# Patient Record
Sex: Female | Born: 1960 | Race: White | Hispanic: No | Smoking: Current every day smoker
Health system: Southern US, Community
[De-identification: ages and names within clinical notes are randomized; demographics above are authoritative.]

## PROBLEM LIST (undated history)

## (undated) DIAGNOSIS — E119 Type 2 diabetes mellitus without complications: Secondary | ICD-10-CM

## (undated) DIAGNOSIS — F319 Bipolar disorder, unspecified: Secondary | ICD-10-CM

## (undated) HISTORY — PX: CHOLECYSTECTOMY: SHX55

## (undated) HISTORY — PX: LEG SURGERY: SHX1003

## (undated) HISTORY — PX: PARTIAL HYSTERECTOMY: SHX80

## (undated) HISTORY — PX: EYE SURGERY: SHX253

## (undated) HISTORY — PX: THYROIDECTOMY: SHX17

---

## 1999-04-17 ENCOUNTER — Emergency Department (HOSPITAL_COMMUNITY): Admission: EM | Admit: 1999-04-17 | Discharge: 1999-04-17 | Payer: Self-pay | Admitting: Emergency Medicine

## 1999-08-13 ENCOUNTER — Emergency Department (HOSPITAL_COMMUNITY): Admission: EM | Admit: 1999-08-13 | Discharge: 1999-08-13 | Payer: Self-pay | Admitting: Emergency Medicine

## 1999-12-06 ENCOUNTER — Emergency Department (HOSPITAL_COMMUNITY): Admission: EM | Admit: 1999-12-06 | Discharge: 1999-12-06 | Payer: Self-pay | Admitting: Emergency Medicine

## 1999-12-10 ENCOUNTER — Inpatient Hospital Stay (HOSPITAL_COMMUNITY): Admission: EM | Admit: 1999-12-10 | Discharge: 1999-12-14 | Payer: Self-pay | Admitting: *Deleted

## 1999-12-19 ENCOUNTER — Encounter: Admission: RE | Admit: 1999-12-19 | Discharge: 1999-12-19 | Payer: Self-pay | Admitting: Neurology

## 1999-12-19 ENCOUNTER — Encounter: Payer: Self-pay | Admitting: Neurology

## 1999-12-20 ENCOUNTER — Emergency Department (HOSPITAL_COMMUNITY): Admission: EM | Admit: 1999-12-20 | Discharge: 1999-12-20 | Payer: Self-pay | Admitting: Emergency Medicine

## 2000-05-19 ENCOUNTER — Emergency Department (HOSPITAL_COMMUNITY): Admission: EM | Admit: 2000-05-19 | Discharge: 2000-05-19 | Payer: Self-pay | Admitting: Emergency Medicine

## 2000-05-19 ENCOUNTER — Encounter: Payer: Self-pay | Admitting: Emergency Medicine

## 2000-06-18 ENCOUNTER — Emergency Department (HOSPITAL_COMMUNITY): Admission: EM | Admit: 2000-06-18 | Discharge: 2000-06-18 | Payer: Self-pay | Admitting: Emergency Medicine

## 2000-06-24 ENCOUNTER — Emergency Department (HOSPITAL_COMMUNITY): Admission: EM | Admit: 2000-06-24 | Discharge: 2000-06-24 | Payer: Self-pay | Admitting: Emergency Medicine

## 2001-01-28 ENCOUNTER — Encounter: Payer: Self-pay | Admitting: Emergency Medicine

## 2001-01-28 ENCOUNTER — Emergency Department (HOSPITAL_COMMUNITY): Admission: EM | Admit: 2001-01-28 | Discharge: 2001-01-28 | Payer: Self-pay | Admitting: Emergency Medicine

## 2001-01-30 ENCOUNTER — Emergency Department (HOSPITAL_COMMUNITY): Admission: EM | Admit: 2001-01-30 | Discharge: 2001-01-31 | Payer: Self-pay | Admitting: *Deleted

## 2001-09-06 ENCOUNTER — Emergency Department (HOSPITAL_COMMUNITY): Admission: EM | Admit: 2001-09-06 | Discharge: 2001-09-06 | Payer: Self-pay | Admitting: Emergency Medicine

## 2001-09-06 ENCOUNTER — Encounter: Payer: Self-pay | Admitting: Emergency Medicine

## 2006-04-10 ENCOUNTER — Emergency Department (HOSPITAL_COMMUNITY): Admission: EM | Admit: 2006-04-10 | Discharge: 2006-04-10 | Payer: Self-pay | Admitting: Emergency Medicine

## 2008-09-25 ENCOUNTER — Emergency Department (HOSPITAL_COMMUNITY): Admission: EM | Admit: 2008-09-25 | Discharge: 2008-09-25 | Payer: Self-pay | Admitting: Emergency Medicine

## 2011-07-27 LAB — GLUCOSE, CAPILLARY

## 2015-02-16 ENCOUNTER — Emergency Department (HOSPITAL_COMMUNITY)
Admission: EM | Admit: 2015-02-16 | Discharge: 2015-02-17 | Disposition: A | Discharge status: Home / Self Care | Payer: Self-pay | Attending: Emergency Medicine | Admitting: Emergency Medicine

## 2015-02-16 DIAGNOSIS — Z765 Malingerer [conscious simulation]: Secondary | ICD-10-CM | POA: Insufficient documentation

## 2015-02-16 DIAGNOSIS — Z720 Tobacco use: Secondary | ICD-10-CM | POA: Insufficient documentation

## 2015-02-16 DIAGNOSIS — Z880 Allergy status to penicillin: Secondary | ICD-10-CM | POA: Insufficient documentation

## 2015-02-16 DIAGNOSIS — Y9389 Activity, other specified: Secondary | ICD-10-CM | POA: Insufficient documentation

## 2015-02-16 DIAGNOSIS — Y998 Other external cause status: Secondary | ICD-10-CM | POA: Insufficient documentation

## 2015-02-16 DIAGNOSIS — M546 Pain in thoracic spine: Secondary | ICD-10-CM

## 2015-02-16 DIAGNOSIS — Z794 Long term (current) use of insulin: Secondary | ICD-10-CM | POA: Insufficient documentation

## 2015-02-16 DIAGNOSIS — M533 Sacrococcygeal disorders, not elsewhere classified: Secondary | ICD-10-CM

## 2015-02-16 DIAGNOSIS — S299XXA Unspecified injury of thorax, initial encounter: Secondary | ICD-10-CM | POA: Insufficient documentation

## 2015-02-16 DIAGNOSIS — Y92410 Unspecified street and highway as the place of occurrence of the external cause: Secondary | ICD-10-CM | POA: Insufficient documentation

## 2015-02-16 DIAGNOSIS — F319 Bipolar disorder, unspecified: Secondary | ICD-10-CM | POA: Insufficient documentation

## 2015-02-16 DIAGNOSIS — E119 Type 2 diabetes mellitus without complications: Secondary | ICD-10-CM | POA: Insufficient documentation

## 2015-02-16 DIAGNOSIS — S3992XA Unspecified injury of lower back, initial encounter: Secondary | ICD-10-CM | POA: Insufficient documentation

## 2015-02-16 DIAGNOSIS — Z79899 Other long term (current) drug therapy: Secondary | ICD-10-CM | POA: Insufficient documentation

## 2015-02-16 HISTORY — DX: Bipolar disorder, unspecified: F31.9

## 2015-02-16 HISTORY — DX: Type 2 diabetes mellitus without complications: E11.9
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
## 2015-02-17 ENCOUNTER — Encounter (HOSPITAL_COMMUNITY): Payer: Self-pay

## 2015-02-17 MED ORDER — CYCLOBENZAPRINE HCL 10 MG PO TABS: 10.0000 mg | ORAL_TABLET | Freq: Three times a day (TID) | ORAL | Status: DC | PRN

## 2015-02-17 NOTE — ED Notes (Signed)
Pt refused to have CBG checked twice. Stated "the doctor was rude and had I have had enough of this." Pt then left room to find her husband in another ED room. Then both patients left without signing out or taking paperwork or prescriptions. Both stating they were unhappy with EDP and outcome.
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
## 2015-02-17 NOTE — ED Notes (Signed)
Pt instructed that her paperwork and prescription was ready, stated she did not want it. Pt then left with her husband, both stating the Doctor was rude, not helpful and they did not want anything from her. Pt was A&Ox4 on departure, all belongings with her as she left.
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
## 2015-06-29 DIAGNOSIS — Z880 Allergy status to penicillin: Secondary | ICD-10-CM | POA: Insufficient documentation

## 2015-06-29 DIAGNOSIS — E119 Type 2 diabetes mellitus without complications: Secondary | ICD-10-CM | POA: Diagnosis not present

## 2015-06-29 DIAGNOSIS — Z794 Long term (current) use of insulin: Secondary | ICD-10-CM | POA: Diagnosis not present

## 2015-06-29 DIAGNOSIS — M546 Pain in thoracic spine: Secondary | ICD-10-CM | POA: Diagnosis not present

## 2015-06-29 DIAGNOSIS — F319 Bipolar disorder, unspecified: Secondary | ICD-10-CM | POA: Insufficient documentation

## 2015-06-29 DIAGNOSIS — Z79899 Other long term (current) drug therapy: Secondary | ICD-10-CM | POA: Insufficient documentation

## 2015-06-29 DIAGNOSIS — Z720 Tobacco use: Secondary | ICD-10-CM | POA: Diagnosis not present

## 2015-06-29 DIAGNOSIS — M545 Low back pain: Secondary | ICD-10-CM | POA: Diagnosis not present

## 2015-06-30 ENCOUNTER — Emergency Department (HOSPITAL_COMMUNITY)
Admission: EM | Admit: 2015-06-30 | Discharge: 2015-06-30 | Disposition: A | Discharge status: Home / Self Care | Payer: Medicaid Other | Attending: Emergency Medicine | Admitting: Emergency Medicine

## 2015-06-30 ENCOUNTER — Emergency Department (HOSPITAL_COMMUNITY): Payer: Medicaid Other

## 2015-06-30 ENCOUNTER — Encounter (HOSPITAL_COMMUNITY): Payer: Self-pay | Admitting: Emergency Medicine

## 2015-06-30 DIAGNOSIS — M5489 Other dorsalgia: Secondary | ICD-10-CM

## 2015-06-30 DIAGNOSIS — W19XXXA Unspecified fall, initial encounter: Secondary | ICD-10-CM

## 2015-06-30 MED ORDER — OXYCODONE-ACETAMINOPHEN 5-325 MG PO TABS: 1.0000 | ORAL_TABLET | Freq: Four times a day (QID) | ORAL | Status: DC | PRN

## 2015-06-30 MED ORDER — OXYCODONE-ACETAMINOPHEN 5-325 MG PO TABS
1.0000 | ORAL_TABLET | Freq: Once | ORAL | Status: AC
Start: 2015-06-30 — End: 2015-06-30
  Administered 2015-06-30: 1 via ORAL
  Filled 2015-06-30: qty 1

## 2015-06-30 NOTE — Discharge Instructions (Signed)
Ibuprofen 600 mg 3 times daily for the next 5 days. Percocet as prescribed as needed for pain not relieved with ibuprofen.  Follow-up with a primary care provider. The resource guide has been provided to assist you with this.   Back Pain, Adult Low back pain is very common. About 1 in 5 people have back pain.The cause of low back pain is rarely dangerous. The pain often gets better over time.About half of people with a sudden onset of back pain feel better in just 2 weeks. About 8 in 10 people feel better by 6 weeks.  CAUSES Some common causes of back pain include:  Strain of the muscles or ligaments supporting the spine.  Wear and tear (degeneration) of the spinal discs.  Arthritis.  Direct injury to the back. DIAGNOSIS Most of the time, the direct cause of low back pain is not known.However, back pain can be treated effectively even when the exact cause of the pain is unknown.Answering your caregiver's questions about your overall health and symptoms is one of the most accurate ways to make sure the cause of your pain is not dangerous. If your caregiver needs more information, he or she may order lab work or imaging tests (X-rays or MRIs).However, even if imaging tests show changes in your back, this usually does not require surgery. HOME CARE INSTRUCTIONS For many people, back pain returns.Since low back pain is rarely dangerous, it is often a condition that people can learn to [facility] their own.   Remain active. It is stressful on the back to sit or stand in one place. Do not sit, drive, or stand in one place for more than 30 minutes at a time. Take short walks on level surfaces as soon as pain allows.Try to increase the length of time you walk each day.  Do not stay in bed.Resting more than 1 or 2 days can delay your recovery.  Do not avoid exercise or work.Your body is made to move.It is not dangerous to be active, even though your back may hurt.Your back will likely heal  faster if you return to being active before your pain is gone.  Pay attention to your body when you bend and lift. Many people have less discomfortwhen lifting if they bend their knees, keep the load close to their bodies,and avoid twisting. Often, the most comfortable positions are those that put less stress on your recovering back.  Find a comfortable position to sleep. Use a firm mattress and lie on your side with your knees slightly bent. If you lie on your back, put a pillow under your knees.  Only take over-the-counter or prescription medicines as directed by your caregiver. Over-the-counter medicines to reduce pain and inflammation are often the most helpful.Your caregiver may prescribe muscle relaxant drugs.These medicines help dull your pain so you can more quickly return to your normal activities and healthy exercise.  Put ice on the injured area.  Put ice in a plastic bag.  Place a towel between your skin and the bag.  Leave the ice on for 15-20 minutes, 03-04 times a day for the first 2 to 3 days. After that, ice and heat may be alternated to reduce pain and spasms.  Ask your caregiver about trying back exercises and gentle massage. This may be of some benefit.  Avoid feeling anxious or stressed.Stress increases muscle tension and can worsen back pain.It is important to recognize when you are anxious or stressed and learn ways to manage it.Exercise is a  great option. SEEK MEDICAL CARE IF:  You have pain that is not relieved with rest or medicine.  You have pain that does not improve in 1 week.  You have new symptoms.  You are generally not feeling well. SEEK IMMEDIATE MEDICAL CARE IF:   You have pain that radiates from your back into your legs.  You develop new bowel or bladder control problems.  You have unusual weakness or numbness in your arms or legs.  You develop nausea or vomiting.  You develop abdominal pain.  You feel faint. Document Released:  10/08/2005 Document Revised: 04/08/2012 Document Reviewed: 02/09/2014 [facility] Patient Information 2015 [place], [place]. This information is not intended to replace advice given to you by your health care provider. Make sure you discuss any questions you have with your health care provider.   Emergency Department Resource Guide 1) Find a Doctor and Pay Out of Pocket Although you won't have to find out who is covered by your insurance plan, it is a good idea to ask around and get recommendations. You will then need to call the office and see if the doctor you have chosen will accept you as a new patient and what types of options they offer for patients who are self-pay. Some doctors offer discounts or will set up payment plans for their patients who do not have insurance, but you will need to ask so you aren't surprised when you get to your appointment.  2) Contact Your Local Health Department Not all health departments have doctors that can see patients for sick visits, but many do, so it is worth a call to see if yours does. If you don't know where your local health department is, you can check in your phone book. The CDC also has a tool to help you locate your state's health department, and many state websites also have listings of all of their local health departments.  3) Find a Walk-in Clinic If your illness is not likely to be very severe or complicated, you may want to try a walk in clinic. These are popping up all over the country in pharmacies, drugstores, and shopping centers. They're usually staffed by nurse practitioners or physician assistants that have been trained to treat common illnesses and complaints. They're usually fairly quick and inexpensive. However, if you have serious medical issues or chronic medical problems, these are probably not your best option.  No Primary Care Doctor: - Call Health Connect at  [phone] - they can help you locate a primary care doctor that  accepts  your insurance, provides certain services, etc. - Physician Referral Service- [phone]  Chronic Pain Problems: Organization         Address  Phone   Notes  [facility]  [phone] Patients need to be referred by their primary care doctor.   Medication Assistance: Organization         Address  Phone   Notes  [facility] Medication [facility] [address] [place] [place], [place] [address] [phone] --Must be a resident of [facility] -- Must have NO insurance coverage whatsoever (no Medicaid/ Medicare, etc.) -- The pt. MUST have a primary care doctor that directs their care regularly and follows them in the community   [facility]  [phone]   [employer]  [phone]    Agencies that provide inexpensive medical care: Organization         Address  Phone   Notes  Patrcia Dolly  [facility]  [phone]   [facility] Internal Medicine    [phone]   [facility] [address] [place], [place] [address] [phone]   [facility] [address] [place], [place] [phone]   Planned Parenthood    6098613880   [facility]    [phone]   [facility]  [address], [place] Phone:  [phone], Fax:  [phone] Hours of Operation:  9 am - 6 pm, M-F.  Also accepts Medicaid/Medicare and self-pay.  [facility]  [address], [place] Phone: [phone], Fax: [phone]. Hours of Operation:  8:30 am - 5:30 pm, M-F.  Also accepts Medicaid and self-pay.  [facility] [place] [address] [place] Phone: [phone]   [facility] [address] Natasha Bence [place], [place] [phone], Ext. [phone] Mondays & Thursdays: 7-9 AM.  First 15 patients are seen on a first come, first serve basis.    Medicaid-accepting [facility] Providers:  Organization          Address  Phone   Notes  [facility] [address],  [phone] Also accepts self-pay patients.  [facility] [address] Laurell Josephs [place], [place]  [phone]   [facility] [address], [place] [phone]   [facility] [address], [place] [phone]   Renaye Rakers [address], [place]   [phone] Only accepts [place] Access [place] patients after they have their name applied to their card.   Self-Pay (no insurance) in [facility]:  Organization         Address  Phone   Notes  Sickle Cell Patients, [facility] Internal Medicine [address] [place], [place] [phone]   [facility] [address] [place], [place] [phone]   [facility]  [address], [place] [phone]   [facility]/Dr. Osei-Bonsu  [address], [place] or [address], [place] [phone] Phone number for both [place] and [place] locations is the same.  [facility]gent [facility] and [facility] [address] [place] [phone]   [facility] [address], [place] or [address] [phone]   [facility] [address] [place] [phone], phone; [phone], fax Sees patients 1st and 3rd Saturday of every month.  Must not qualify for public or private insurance (i.e. Medicaid, Medicare, [place] Health Choice, Veterans' Benefits)  Household income should be no more than 200% of the poverty level The clinic cannot treat you if you are pregnant or think you are pregnant  Sexually transmitted diseases are not treated at the clinic.    Dental Care: Organization         Address  Phone  Notes  [facility] Department of [facility] [address] [place],  [place] [phone] Accepts children up to age 45 who are enrolled in [place] or [place] Health Choice; pregnant women with a Medicaid card; and children who have applied for Medicaid or [place] Health Choice, but were declined, whose parents can pay a reduced fee at time of service.  [facility] of [facility] [place]  [address], [place] [phone] Accepts children up to age 56 who are enrolled in Medicaid or [place] Health Choice; pregnant women with a Medicaid card; and children who have applied for Medicaid or [place] Health Choice, but were declined, whose parents can pay a reduced fee at time of service.  [facility]  [address] [place], [place] [phone] Patients are seen by appointment only. Walk-ins are not accepted. [facility] will see patients 45 years of age and older. Monday - Tuesday (8am-5pm) Most Wednesdays (8:30-5pm) $30 per visit, cash only  [facility]  [address], [facility] [phone] Patients are seen by appointment only. Walk-ins are not accepted. [facility] will see patients 64 years of age and older. One Wednesday Evening (Monthly: Volunteer Based).  $30 per visit, cash only  [employer]  [phone] for adults; Children under age 34, call Graduate Pediatric Dentistry at [phone]. Children aged 105-14, please call [phone] to request a pediatric application.  Dental services are provided in all areas of dental care including fillings, crowns and bridges, complete and partial dentures, implants, gum treatment, root canals, and extractions. Preventive care is also provided. Treatment is provided to both adults and children. Patients are selected via a lottery and there is often a waiting list.   [facility] [address] [place]  [phone] [email or web address]   [facility] [address] [place], [place]  [phone], Ext. [phone] Second and Fourth Thursday of each month, opens at 6:30 AM; Clinic ends at 9 AM.  Patients are seen on a first-come first-served basis, and a limited number are seen during each clinic.   [facility]  [address] Ether Griffins [place], [place] [phone]   Eligibility Requirements You must have lived in [place], [place], or [place] counties for at least the last three months.   You cannot be eligible for state or federal sponsored [place], including [employer], [place], or [employer].   You generally cannot be eligible for healthcare insurance through your employer.    How to apply: Eligibility screenings are held every Tuesday and Wednesday afternoon from 1:00 pm until 4:00 pm. You do not need an appointment for the interview!  [facility] [address], [place], [place] [phone]   [facility]  [phone]   [facility] Health Department  [phone]   [facility] Health Department  [phone]    Behavioral Health Resources in the Community: Intensive Outpatient Programs Organization         Address  Phone  Notes  [facility] Services [address] [place], [place] [phone]   [facility] Outpatient [address] [place], [place] [phone]   [facility] [address], [place], [place]  [phone]   [facility] Mental Health [address]  [place], [place] [phone] or [phone]   Substance Abuse Resources Organization         Address  Phone  Notes  [facility]  [phone]   [facility]  [phone]   [facility] [place]  [phone]   Floydene Flock  [phone]   Residential & Outpatient Substance Abuse Program  [phone]   Psychological Services Organization         Address  Phone  Notes  [facility]  [phone]  [phone]   [facility] Services  [phone]    [facility] Mental Health [address] [place] [phone] or [phone]    Mobile Crisis Teams Organization         Address  Phone  Notes  [facility], Mobile Crisis Care Unit  [phone]   [facility]  [address] [place], [place] [phone]   Doristine Locks [address] [place] [phone]    Self-Help/Support Groups Organization         Address  Phone             Notes  [facility] - variety of support groups  336- I7437963 Call for more information  Narcotics Anonymous (NA), [facility] [address], [employer] [place]  2 meetings at this location   Statistician         Address  Phone  Notes  [facility] [address],    [place]  [phone]   [facility]  [address] [place] [address], [place] [address], [place], [place] [phone]   [facility] [address] [place], [place] [phone] Admissions: 8am-3pm M-F  [facility] [address],    [place], [place] [phone]   [facility] [address] [place], [place], [place] [phone]   [facility] [address]  [place], [place] [phone]   [facility] - Intensive Outpatient [address] Laurell Josephs [address], [place], [place] [phone]   [facility] ([facility] [address]  [place], [place] [phone] or [phone]   [facility] ([facility]) [address] [place], [place] [phone] Accepts Medicaid  [facility] [place] [address]  [place] [phone] Substance Abuse/Addiction Treatment   [facility] Organization         Address  Phone  Notes  [facility]  [phone]   Angie Fava, PhD [address] Ervin Knack [place], [place]   [phone] or 7071778776   [facility] Behavioral   [address] [place], [place] [phone]   [facility] [address], [place], [place] [phone] Insurance/Medicaid/sponsorship through [facility] [address]                                    [place], [place] [phone] Therapy/tele-psych/case  [facility] [address], [place] [phone]    Dr. Lolly Mustache  [phone]   [facility]  [employer] [facility] Dept. 1) [address] [place] 2) [address] [place] 3)  [address], [place] [phone]  [phone]   [facility] Child Abuse Hotline 580-002-3738 or [phone] (After Hours)

## 2015-06-30 NOTE — ED Notes (Signed)
Pt states she had diabetic seizure in bathtub tonight and now c/o increased back pain.

## 2015-06-30 NOTE — ED Provider Notes (Signed)
CSN: [id number]     Arrival date & time 06/29/15  2341 History   First MD Initiated Contact with Patient 06/30/15 0112     Chief Complaint  Patient presents with  . Back Pain     (Consider location/radiation/quality/duration/timing/severity/associated sxs/prior Treatment) HPI Comments: Patient is a 54 year old female with history of diabetes, hypertension, osteoporosis. She presents for evaluation of pain in her upper and lower back. She states this evening she experienced a hypoglycemic seizure while she was in the shower. Her husband came home and found her actively seizing. He gave her a rescue glucagon injection and she came around. She is now complaining of pain in her entire back. She denies any radiation into her legs. She denies any bowel or bladder complaints.  Patient is a 54 y.o. female presenting with back pain. The history is provided by the patient.  Back Pain Location:  Thoracic spine and lumbar spine Quality:  Stiffness Stiffness is present:  In the morning and all day Radiates to:  Does not radiate Pain severity:  Moderate Onset quality:  Sudden Timing:  Constant Progression:  Unchanged Chronicity:  New Relieved by:  Nothing Worsened by:  Ambulation, movement and standing   Past Medical History  Diagnosis Date  . Diabetes mellitus without complication   . Bipolar 1 disorder    Past Surgical History  Procedure Laterality Date  . Thyroidectomy    . Cholecystectomy    . Partial hysterectomy     History reviewed. No pertinent family history. Social History  Substance Use Topics  . Smoking status: Current Every Day Smoker  . Smokeless tobacco: None  . Alcohol Use: No   OB History    No data available     Review of Systems  Musculoskeletal: Positive for back pain.  All other systems reviewed and are negative.     Allergies  Compazine; Inapsine; Hydrocodone; and Penicillins  Home Medications   Prior to Admission medications   Medication Sig Start  Date End Date Taking? Authorizing Provider  clonazePAM (KLONOPIN) 1 MG tablet Take 1 mg by mouth 2 (two) times daily.   Yes Historical Provider, MD  insulin aspart (NOVOLOG) 100 UNIT/ML injection Inject 2-10 Units into the skin 3 (three) times daily before meals.   Yes Historical Provider, MD  Insulin Glargine (TOUJEO SOLOSTAR [place]) Inject 35 Units into the skin at bedtime.   Yes Historical Provider, MD  lisinopril (PRINIVIL,ZESTRIL) 10 MG tablet Take 10 mg by mouth daily.   Yes Historical Provider, MD  lovastatin (MEVACOR) 40 MG tablet Take 40 mg by mouth at bedtime.   Yes Historical Provider, MD  buPROPion (WELLBUTRIN) 100 MG tablet Take 500 mg by mouth 2 (two) times daily.    Historical Provider, MD  cyclobenzaprine (FLEXERIL) 10 MG tablet Take 1 tablet (10 mg total) by mouth 3 (three) times daily as needed (pain). 02/17/15   Devoria Albe, MD  divalproex (DEPAKOTE) 500 MG DR tablet Take 500 mg by mouth 3 (three) times daily.    Historical Provider, MD  methadone (DOLOPHINE) 10 MG tablet Take 10 mg by mouth every 4 (four) hours. Pt gets #240 monthly from Dr Valorie Roosevelt in [place], last filled 3/21    Historical Provider, MD  oxycodone (ROXICODONE) 30 MG immediate release tablet Take 30 mg by mouth every 8 (eight) hours as needed for pain (# 90 tabs filled monthly prescribed by Dr Zonia Kief in [place], last filled 3/21).     Historical Provider, MD  phentermine 37.5 MG capsule Take  37.5 mg by mouth every morning. #30 tabs prescribed by Dr Valorie Roosevelt in [place], [place] last filled 3/17    Historical Provider, MD  sertraline (ZOLOFT) 100 MG tablet Take 100 mg by mouth daily.    Historical Provider, MD   BP 106/81 mmHg  Pulse 90  Temp(Src) 99.1 F (37.3 C)  Resp 18  Ht  (1.651 m)  Wt 127 lb (57.607 kg)  BMI 21.13 kg/m2  SpO2 100% Physical Exam  Constitutional: She is oriented to person, place, and time. She appears well-developed and well-nourished. No distress.  HENT:  Head: Normocephalic  and atraumatic.  Neck: Normal range of motion. Neck supple.  Cardiovascular: Normal rate and regular rhythm.  Exam reveals no gallop and no friction rub.   No murmur heard. Pulmonary/Chest: Effort normal and breath sounds normal. No respiratory distress. She has no wheezes.  Abdominal: Soft. Bowel sounds are normal. She exhibits no distension. There is no tenderness.  Musculoskeletal: Normal range of motion.  There is tenderness to palpation in the soft tissues of the thoracic through lumbar region. There is no bony tenderness and no step-off.  Neurological: She is alert and oriented to person, place, and time.  DTRs are 2+ and symmetrical in the patellar and Achilles tendons bilaterally. She is ambulatory without difficulty.  Skin: Skin is warm and dry. She is not diaphoretic.  Nursing note and vitals reviewed.   ED Course  Procedures (including critical care time) Labs Review Labs Reviewed - No data to display  Imaging Review No results found. I have personally reviewed and evaluated these images and lab results as part of my medical decision-making.   EKG Interpretation None      MDM   Final diagnoses:  Fall    X-rays reveal no evidence for fracture and there are no red flags on her physical exam or history that would suggest an emergent situation. She will be given pain medication which she can take for her back pain. She is to follow-up with a primary Dr. if she is not improving in the next week.    Geoffery Lyons, MD 06/30/15 [phone]

## 2016-07-03 ENCOUNTER — Emergency Department (HOSPITAL_COMMUNITY): Payer: Medicare Other

## 2016-07-03 ENCOUNTER — Encounter (HOSPITAL_COMMUNITY): Payer: Self-pay | Admitting: Emergency Medicine

## 2016-07-03 ENCOUNTER — Emergency Department (HOSPITAL_COMMUNITY)
Admission: EM | Admit: 2016-07-03 | Discharge: 2016-07-03 | Disposition: A | Discharge status: Home / Self Care | Payer: Medicare Other | Attending: Emergency Medicine | Admitting: Emergency Medicine

## 2016-07-03 DIAGNOSIS — Z794 Long term (current) use of insulin: Secondary | ICD-10-CM | POA: Diagnosis not present

## 2016-07-03 DIAGNOSIS — S59901A Unspecified injury of right elbow, initial encounter: Secondary | ICD-10-CM | POA: Diagnosis present

## 2016-07-03 DIAGNOSIS — Z79899 Other long term (current) drug therapy: Secondary | ICD-10-CM | POA: Diagnosis not present

## 2016-07-03 DIAGNOSIS — S46911A Strain of unspecified muscle, fascia and tendon at shoulder and upper arm level, right arm, initial encounter: Secondary | ICD-10-CM | POA: Diagnosis not present

## 2016-07-03 DIAGNOSIS — X501XXA Overexertion from prolonged static or awkward postures, initial encounter: Secondary | ICD-10-CM | POA: Diagnosis not present

## 2016-07-03 DIAGNOSIS — Y999 Unspecified external cause status: Secondary | ICD-10-CM | POA: Insufficient documentation

## 2016-07-03 DIAGNOSIS — E119 Type 2 diabetes mellitus without complications: Secondary | ICD-10-CM | POA: Insufficient documentation

## 2016-07-03 DIAGNOSIS — Y939 Activity, unspecified: Secondary | ICD-10-CM | POA: Diagnosis not present

## 2016-07-03 DIAGNOSIS — F17200 Nicotine dependence, unspecified, uncomplicated: Secondary | ICD-10-CM | POA: Diagnosis not present

## 2016-07-03 DIAGNOSIS — Y929 Unspecified place or not applicable: Secondary | ICD-10-CM | POA: Insufficient documentation

## 2016-07-03 MED ORDER — ONDANSETRON HCL 4 MG PO TABS: 4.0000 mg | ORAL_TABLET | Freq: Four times a day (QID) | ORAL | 0 refills | Status: AC

## 2016-07-03 MED ORDER — OXYCODONE-ACETAMINOPHEN 5-325 MG PO TABS
1.0000 | ORAL_TABLET | Freq: Once | ORAL | Status: AC
  Administered 2016-07-03: 1 via ORAL
  Filled 2016-07-03: qty 1

## 2016-07-03 MED ORDER — ONDANSETRON HCL 4 MG PO TABS
4.0000 mg | ORAL_TABLET | Freq: Once | ORAL | Status: AC
  Administered 2016-07-03: 4 mg via ORAL
  Filled 2016-07-03: qty 1

## 2016-07-03 MED ORDER — OXYCODONE-ACETAMINOPHEN 5-325 MG PO TABS: 1.0000 | ORAL_TABLET | Freq: Four times a day (QID) | ORAL | 0 refills | Status: DC | PRN

## 2016-07-03 NOTE — ED Provider Notes (Signed)
[facility]-EMERGENCY DEPT Provider Note   CSN: [id number] Arrival date & time: 07/03/16  1826     History   Chief Complaint Chief Complaint  Patient presents with  . Arm Pain    HPI Sylvia Greene is a 55 y.o. female.  Patient is a 55 year old female who presents to the emergency department with a complaint of right elbow pain.  The patient states that on yesterday evening she got tangled in her own feet and fell. She states she did not land on the elbow, but landing on the outstretched hand caused elbow related pain. She states now she cannot fully extend or bend the right elbow. There is no complaint of shoulder pain, wrist pain, or finger pain on the right side. No other injury reported.   The history is provided by the patient.  Arm Pain     Past Medical History:  Diagnosis Date  . Bipolar 1 disorder (HCC)   . Diabetes mellitus without complication (HCC)     There are no active problems to display for this patient.   Past Surgical History:  Procedure Laterality Date  . CHOLECYSTECTOMY    . PARTIAL HYSTERECTOMY    . THYROIDECTOMY      OB History    No data available       Home Medications    Prior to Admission medications   Medication Sig Start Date End Date Taking? Authorizing Provider  buPROPion (WELLBUTRIN) 100 MG tablet Take 500 mg by mouth 2 (two) times daily.    Historical Provider, MD  clonazePAM (KLONOPIN) 1 MG tablet Take 1 mg by mouth 2 (two) times daily.    Historical Provider, MD  cyclobenzaprine (FLEXERIL) 10 MG tablet Take 1 tablet (10 mg total) by mouth 3 (three) times daily as needed (pain). 02/17/15   Devoria Albe, MD  divalproex (DEPAKOTE) 500 MG DR tablet Take 500 mg by mouth 3 (three) times daily.    Historical Provider, MD  insulin aspart (NOVOLOG) 100 UNIT/ML injection Inject 2-10 Units into the skin 3 (three) times daily before meals.    Historical Provider, MD  Insulin Glargine (TOUJEO SOLOSTAR [place]ens) Inject 35 Units into the skin at bedtime.     Historical Provider, MD  lisinopril (PRINIVIL,ZESTRIL) 10 MG tablet Take 10 mg by mouth daily.    Historical Provider, MD  lovastatin (MEVACOR) 40 MG tablet Take 40 mg by mouth at bedtime.    Historical Provider, MD  methadone (DOLOPHINE) 10 MG tablet Take 10 mg by mouth every 4 (four) hours. Pt gets #240 monthly from Dr Valorie Roosevelt in [place], last filled 3/21    Historical Provider, MD  oxycodone (ROXICODONE) 30 MG immediate release tablet Take 30 mg by mouth every 8 (eight) hours as needed for pain (# 90 tabs filled monthly prescribed by Dr Zonia Kief in [place], last filled 3/21).     Historical Provider, MD  oxyCODONE-acetaminophen (PERCOCET) 5-325 MG per tablet Take 1-2 tablets by mouth every 6 (six) hours as needed. 06/30/15   Geoffery Lyons, MD  phentermine 37.5 MG capsule Take 37.5 mg by mouth every morning. #30 tabs prescribed by Dr Valorie Roosevelt in [place], [place] last filled 3/17    Historical Provider, MD  sertraline (ZOLOFT) 100 MG tablet Take 100 mg by mouth daily.    Historical Provider, MD    Family History History reviewed. No pertinent family history.  Social History Social History  Substance Use Topics  . Smoking status: Current Every Day Smoker  . Smokeless tobacco: Never Used  .  Alcohol use No     Allergies   Compazine [prochlorperazine maleate]; Inapsine [droperidol]; Hydrocodone; and Penicillins   Review of Systems Review of Systems  Musculoskeletal: Positive for arthralgias.  Psychiatric/Behavioral:       Bipolar illness  All other systems reviewed and are negative.    Physical Exam Updated Vital Signs BP 129/78 (BP Location: Left Arm)   Pulse 91   Temp 98.7 F (37.1 C) (Oral)   Resp 20   SpO2 100%   Physical Exam  Constitutional: She is oriented to person, place, and time. She appears well-developed and well-nourished.  Non-toxic appearance.  HENT:  Head: Normocephalic.  Right Ear: Tympanic membrane and external ear normal.  Left Ear: Tympanic  membrane and external ear normal.  Eyes: EOM and lids are normal. Pupils are equal, round, and reactive to light.  Neck: Normal range of motion. Neck supple. Carotid bruit is not present.  Cardiovascular: Normal rate, regular rhythm, normal heart sounds, intact distal pulses and normal pulses.   Pulmonary/Chest: Breath sounds normal. No respiratory distress.  Abdominal: Soft. Bowel sounds are normal. There is no tenderness. There is no guarding.  Musculoskeletal:       Right shoulder: Normal.       Right elbow: She exhibits decreased range of motion. She exhibits no swelling and no deformity. Tenderness found.       Right wrist: Normal.  Lymphadenopathy:       Head (right side): No submandibular adenopathy present.       Head (left side): No submandibular adenopathy present.    She has no cervical adenopathy.  Neurological: She is alert and oriented to person, place, and time. She has normal strength. No cranial nerve deficit or sensory deficit.  Skin: Skin is warm and dry.  Psychiatric: She has a normal mood and affect. Her speech is normal.  Nursing note and vitals reviewed.    ED Treatments / Results  Labs (all labs ordered are listed, but only abnormal results are displayed) Labs Reviewed - No data to display  EKG  EKG Interpretation None       Radiology Dg Elbow 2 Views Right  Result Date: 07/03/2016 CLINICAL DATA:  Fall with pain. EXAM: RIGHT ELBOW - 2 VIEW COMPARISON:  None. FINDINGS: There is no evidence of fracture, dislocation, or joint effusion. There is no evidence of arthropathy or other focal bone abnormality. Soft tissues are unremarkable. IMPRESSION: Negative. Electronically Signed   By: Gerome Sam III M.D   On: 07/03/2016 18:59    Procedures Procedures (including critical care time)  Medications Ordered in ED Medications - No data to display   Initial Impression / Assessment and Plan / ED Course  I have reviewed the triage vital signs and the  nursing notes.  Pertinent labs & imaging results that were available during my care of the patient were reviewed by me and considered in my medical decision making (see chart for details).  Clinical Course    *I have reviewed nursing notes, vital signs, and all appropriate lab and imaging results for this patient.  Final Clinical Impressions(s) / ED Diagnoses  Patient requesting assistance with her pain for today and tonight until she can possibly see a physician on in the next 24-48 hours. Records indicate patient has been affiliated with a pain management clinic. The patient states she is no longer seeing pain management at [facility]. She states that her primary physician and was in [place] and she is only here to  visit a relative. The x-ray is negative. The patient is fitted with a sling to assist with discomfort. Prescription for 6 tablets of Percocet given to the patient. Patient is strongly advised to see Dr. Romeo AppleHarrison, or one of the orthopedic specialist in the [place] concerning her elbow. Patient is in agreement with this discharge plan.    Final diagnoses:  None    New Prescriptions New Prescriptions   No medications on file     Ivery QualeHobson Keiarah Orlowski, PA-C 07/03/16 1946    Ivery QualeHobson Naol Ontiveros, PA-C 07/03/16 1955    Marily MemosJason Mesner, MD 07/03/16 [id number]

## 2016-07-03 NOTE — ED Notes (Signed)
MD at bedside. 

## 2016-07-03 NOTE — Discharge Instructions (Signed)
Your vital signs within normal limits. The x-ray of your elbow is negative for fracture or dislocation or effusion. No gross neurovascular changes are noted on examination at this time. Please use your sling until seen by an orthopedic specialist. Please see Dr. Romeo AppleHarrison, or one of the orthopedic specialist in [place] for reevaluation of your elbow pain.

## 2016-07-03 NOTE — ED Notes (Signed)
Pt walking to xray with tech.

## 2016-07-03 NOTE — ED Triage Notes (Signed)
Pt c/o right elbow pain since falling yesterday.

## 2016-09-28 ENCOUNTER — Emergency Department (HOSPITAL_COMMUNITY)
Admission: EM | Admit: 2016-09-28 | Discharge: 2016-09-29 | Disposition: A | Discharge status: Home / Self Care | Payer: Medicare Other | Attending: Emergency Medicine | Admitting: Emergency Medicine

## 2016-09-28 ENCOUNTER — Encounter (HOSPITAL_COMMUNITY): Payer: Self-pay | Admitting: Emergency Medicine

## 2016-09-28 ENCOUNTER — Emergency Department (HOSPITAL_COMMUNITY): Payer: Medicare Other

## 2016-09-28 DIAGNOSIS — R0789 Other chest pain: Secondary | ICD-10-CM | POA: Insufficient documentation

## 2016-09-28 DIAGNOSIS — E119 Type 2 diabetes mellitus without complications: Secondary | ICD-10-CM | POA: Insufficient documentation

## 2016-09-28 DIAGNOSIS — R1011 Right upper quadrant pain: Secondary | ICD-10-CM | POA: Diagnosis present

## 2016-09-28 DIAGNOSIS — R319 Hematuria, unspecified: Secondary | ICD-10-CM | POA: Diagnosis not present

## 2016-09-28 DIAGNOSIS — Z794 Long term (current) use of insulin: Secondary | ICD-10-CM | POA: Diagnosis not present

## 2016-09-28 DIAGNOSIS — Z79899 Other long term (current) drug therapy: Secondary | ICD-10-CM | POA: Diagnosis not present

## 2016-09-28 DIAGNOSIS — R079 Chest pain, unspecified: Secondary | ICD-10-CM

## 2016-09-28 DIAGNOSIS — F17200 Nicotine dependence, unspecified, uncomplicated: Secondary | ICD-10-CM | POA: Insufficient documentation

## 2016-09-28 DIAGNOSIS — R109 Unspecified abdominal pain: Secondary | ICD-10-CM

## 2016-09-28 LAB — CBC
HCT: 40.6 % (ref 36.0–46.0)
Hemoglobin: 13.4 g/dL (ref 12.0–15.0)
MCH: 30.3 pg (ref 26.0–34.0)
MCHC: 33 g/dL (ref 30.0–36.0)
MCV: 91.9 fL (ref 78.0–100.0)
PLATELETS: 263 10*3/uL (ref 150–400)
RBC: 4.42 MIL/uL (ref 3.87–5.11)
RDW: 13.8 % (ref 11.5–15.5)
WBC: 8.4 10*3/uL (ref 4.0–10.5)

## 2016-09-28 LAB — BASIC METABOLIC PANEL
Anion gap: 8 (ref 5–15)
BUN: 9 mg/dL (ref 6–20)
CHLORIDE: 103 mmol/L (ref 101–111)
CO2: 30 mmol/L (ref 22–32)
CREATININE: 0.68 mg/dL (ref 0.44–1.00)
Calcium: 9.5 mg/dL (ref 8.9–10.3)
GFR calc non Af Amer: >60 mL/min (ref >60)
Glucose, Bld: 108 mg/dL — ABNORMAL HIGH (ref 65–99)
Potassium: 4.3 mmol/L (ref 3.5–5.1)
Sodium: 141 mmol/L (ref 135–145)

## 2016-09-28 LAB — URINALYSIS, MICROSCOPIC (REFLEX): WBC UA: NONE SEEN WBC/hpf (ref 0–5)

## 2016-09-28 LAB — HEPATIC FUNCTION PANEL
ALK PHOS: 103 U/L (ref 38–126)
ALT: 41 U/L (ref 14–54)
AST: 34 U/L (ref 15–41)
Albumin: 3.6 g/dL (ref 3.5–5.0)
BILIRUBIN TOTAL: 0.1 mg/dL — AB (ref 0.3–1.2)
Bilirubin, Direct: <0.1 mg/dL — ABNORMAL LOW (ref 0.1–0.5)
TOTAL PROTEIN: 6.3 g/dL — AB (ref 6.5–8.1)

## 2016-09-28 LAB — URINALYSIS, ROUTINE W REFLEX MICROSCOPIC
BILIRUBIN URINE: NEGATIVE
Glucose, UA: NEGATIVE mg/dL
Ketones, ur: NEGATIVE mg/dL
NITRITE: NEGATIVE
PH: 6.5 (ref 5.0–8.0)
Protein, ur: NEGATIVE mg/dL

## 2016-09-28 LAB — I-STAT TROPONIN, ED: Troponin i, poc: 0 ng/mL (ref 0.00–0.08)

## 2016-09-28 LAB — LIPASE, BLOOD: Lipase: 18 U/L (ref 11–51)

## 2016-09-28 LAB — D-DIMER, QUANTITATIVE: D-Dimer, Quant: <0.27 ug/mL-FEU (ref 0.00–0.50)

## 2016-09-28 MED ORDER — ASPIRIN 81 MG PO CHEW
324.0000 mg | CHEWABLE_TABLET | Freq: Once | ORAL | Status: AC
  Administered 2016-09-28: 324 mg via ORAL
  Filled 2016-09-28: qty 4

## 2016-09-28 MED ORDER — MORPHINE SULFATE (PF) 4 MG/ML IV SOLN
4.0000 mg | Freq: Once | INTRAVENOUS | Status: AC
  Administered 2016-09-28: 4 mg via INTRAVENOUS
  Filled 2016-09-28: qty 1

## 2016-09-28 MED ORDER — SODIUM CHLORIDE 0.9 % IV BOLUS (SEPSIS)
1000.0000 mL | Freq: Once | INTRAVENOUS | Status: AC
  Administered 2016-09-28: 1000 mL via INTRAVENOUS

## 2016-09-28 MED ORDER — ONDANSETRON HCL 4 MG/2ML IJ SOLN
4.0000 mg | Freq: Once | INTRAMUSCULAR | Status: AC
  Administered 2016-09-28: 4 mg via INTRAVENOUS
  Filled 2016-09-28: qty 2

## 2016-09-28 NOTE — ED Notes (Signed)
Patient transported to CT 

## 2016-09-28 NOTE — ED Notes (Signed)
Pt requests to know the current wait time. Unable to give patient an exact time but made aware that we see patients by acuity with time and will continue to monitor pt vitals and labs and move to a room once available.

## 2016-09-28 NOTE — ED Triage Notes (Signed)
Patient reports central chest tightness with SOB , RUQ pain , emesis and diarrhea onset today . Denies fever or chills .

## 2016-09-28 NOTE — ED Notes (Signed)
Patient returned from CT

## 2016-09-28 NOTE — ED Provider Notes (Signed)
[facility]-EMERGENCY DEPT Provider Note   CSN: [id number] Arrival date & time: 09/28/16  2000     History   Chief Complaint Chief Complaint  Patient presents with  . Chest Pain  . Abdominal Pain    Emesis/Diarrhea    HPI Sylvia Greene is a 55 y.o. female with a hx of IDDM, cholecystectomy presents to the Emergency Department complaining of gradual, persistent, progressively worsening epigastic and RUQ pain onset after waking this morning. Associated symptoms include 6 episodes of NBNB emesis and 10 episodes of Watery diarrhea without melena or hematochezia. She also complains of generalized weakness and fatigue.  No known aggravating or alleviating factors. Last episode of emesis and diarrhea was approximately 1 hour ago.  No known sick contacts. No new foods.  Patient reports her blood sugar has read "high" on the meter all day.  She has a history of kidney stones with similar symptoms.  Additionally patient is complaining of central chest pain described as squeezing and pressure. She has associated shortness of breath. Patient denies pain like this before. She states it began about the same time as her abdominal pain. She is a current 1 pack per day smoker.  Denies a history of a DVT or PE. Last surgery was one year ago. No recent fractures or falls. No periods of immobilization. Patient does not take estrogen. She is complaining of one-month of night sweats almost every night. She reports significant weight loss approximately 3 months ago but has begun to gain her weight back.  She has an albuterol inhaler but did not attempt to use it.  Pt reports intermittent CP x 1 year.  She had a stress echo performed through her PCP last year that was normal (unable to locate in the EMR).    The history is provided by the patient and medical records. No language interpreter was used.    Past Medical History:  Diagnosis Date  . Bipolar 1 disorder (HCC)   . Diabetes mellitus without complication (HCC)       There are no active problems to display for this patient.   Past Surgical History:  Procedure Laterality Date  . CHOLECYSTECTOMY    . EYE SURGERY    . LEG SURGERY    . PARTIAL HYSTERECTOMY    . THYROIDECTOMY      OB History    No data available       Home Medications    Prior to Admission medications   Medication Sig Start Date End Date Taking? Authorizing Provider  buPROPion (WELLBUTRIN) 100 MG tablet Take 500 mg by mouth 2 (two) times daily.   Yes Historical Provider, MD  clonazePAM (KLONOPIN) 0.5 MG tablet Take 0.5 mg by mouth 3 (three) times daily.   Yes Historical Provider, MD  insulin aspart (NOVOLOG) 100 UNIT/ML injection Inject 2-10 Units into the skin 3 (three) times daily before meals.   Yes Historical Provider, MD  Insulin Glargine (TOUJEO SOLOSTAR Johnson) Inject 35 Units into the skin at bedtime.   Yes Historical Provider, MD  lisinopril (PRINIVIL,ZESTRIL) 10 MG tablet Take 10 mg by mouth daily.   Yes Historical Provider, MD  lovastatin (MEVACOR) 40 MG tablet Take 40 mg by mouth at bedtime.   Yes Historical Provider, MD  ondansetron (ZOFRAN) 4 MG tablet Take 1 tablet (4 mg total) by mouth every 6 (six) hours. 07/03/16  Yes Ivery Quale, PA-C  sertraline (ZOLOFT) 100 MG tablet Take 100 mg by mouth daily.   Yes Historical Provider, MD  ondansetron (ZOFRAN ODT) 4 MG disintegrating tablet 4mg  ODT q4 hours prn nausea/vomit 09/29/16   Brandan Glauber, PA-C    Family History No family history on file.  Social History Social History  Substance Use Topics  . Smoking status: Current Every Day Smoker  . Smokeless tobacco: Never Used  . Alcohol use No     Allergies   Compazine [prochlorperazine maleate]; Inapsine [droperidol]; Hydrocodone; and Penicillins   Review of Systems Review of Systems  Respiratory: Positive for chest tightness and shortness of breath.   Cardiovascular: Positive for chest pain.  Gastrointestinal: Positive for abdominal pain, diarrhea  and vomiting.  Genitourinary: Negative for dysuria.  Musculoskeletal: Positive for back pain ( Chronic but worse tonight).  All other systems reviewed and are negative.    Physical Exam Updated Vital Signs BP 100/82 (BP Location: Right Arm)   Pulse 91   Resp 20   Ht 5' 5.5" (1.664 m)   Wt 62.6 kg   SpO2 96%   BMI 22.62 kg/m   Physical Exam  Constitutional: She appears well-developed and well-nourished. No distress.  Awake, alert, nontoxic appearance  HENT:  Head: Normocephalic and atraumatic.  Mouth/Throat: Oropharynx is clear and moist. No oropharyngeal exudate.  Eyes: Conjunctivae are normal. No scleral icterus.  Neck: Normal range of motion. Neck supple.  Cardiovascular: Normal rate, regular rhythm and intact distal pulses.   Pulses:      Radial pulses are 2+ on the right side, and 2+ on the left side.       Dorsalis pedis pulses are 2+ on the right side, and 2+ on the left side.  Pulmonary/Chest: Effort normal and breath sounds normal. No respiratory distress. She has no wheezes.  Equal chest expansion  Abdominal: Soft. Bowel sounds are normal. She exhibits no mass. There is no tenderness. There is no rebound and no guarding.  Musculoskeletal: Normal range of motion. She exhibits no edema.  Neurological: She is alert.  Speech is clear and goal oriented Moves extremities without ataxia  Skin: Skin is warm and dry. She is not diaphoretic.  Psychiatric: She has a normal mood and affect.  Nursing note and vitals reviewed.    ED Treatments / Results  Labs (all labs ordered are listed, but only abnormal results are displayed) Labs Reviewed  BASIC METABOLIC PANEL - Abnormal; Notable for the following:       Result Value   Glucose, Bld 108 (*)    All other components within normal limits  URINALYSIS, ROUTINE W REFLEX MICROSCOPIC - Abnormal; Notable for the following:    Color, Urine STRAW (*)    Specific Gravity, Urine <1.005 (*)    Hgb urine dipstick LARGE (*)     Leukocytes, UA TRACE (*)    All other components within normal limits  URINALYSIS, MICROSCOPIC (REFLEX) - Abnormal; Notable for the following:    Bacteria, UA RARE (*)    Squamous Epithelial / LPF 0-5 (*)    All other components within normal limits  HEPATIC FUNCTION PANEL - Abnormal; Notable for the following:    Total Protein 6.3 (*)    Total Bilirubin 0.1 (*)    Bilirubin, Direct <0.1 (*)    All other components within normal limits  CBC  LIPASE, BLOOD  D-DIMER, QUANTITATIVE (NOT AT [facility])  Rosezena SensorI-STAT TROPOININ, ED  Rosezena SensorI-STAT TROPOININ, ED    EKG  EKG Interpretation  Date/Time:  Friday September 28 2016 20:13:34 EST Ventricular Rate:  105 PR Interval:  154 QRS Duration: 88 QT  Interval:  342 QTC Calculation: 452 R Axis:   86 Text Interpretation:  Sinus tachycardia Cannot rule out Anterior infarct , age undetermined Abnormal ECG no prior EKG  Confirmed by LIU MD, DANA [phone]([id number]) on 09/29/2016 12:07:16 AM       Radiology Dg Chest 2 View  Result Date: 09/28/2016 CLINICAL DATA:  Chest pain. Central chest tightness and shortness of breath. EXAM: CHEST  2 VIEW COMPARISON:  None. FINDINGS: The cardiomediastinal contours are normal. Linear right lung base density is the appearance of scarring. Pulmonary vasculature is normal. No consolidation, pleural effusion, or pneumothorax. No acute osseous abnormalities are seen. Chronic wedging lower thoracic vertebra compared to thoracic spine radiographs 03/24/2015. IMPRESSION: No active cardiopulmonary disease. Electronically Signed   By: Rubye OaksMelanie  Ehinger M.D.   On: 09/28/2016 21:46   Ct Renal Stone Study  Result Date: 09/28/2016 CLINICAL DATA:  Right-sided abdominal and flank pain. Onset this morning. EXAM: CT ABDOMEN AND PELVIS WITHOUT CONTRAST TECHNIQUE: Multidetector CT imaging of the abdomen and pelvis was performed following the standard protocol without IV contrast. COMPARISON:  None. FINDINGS: Lower chest: Linear atelectasis or scarring in the  right middle lobe an dependent lung bases. No pleural fluid. Hepatobiliary: No focal liver abnormality is seen. Status post cholecystectomy. No biliary dilatation. Pancreas: No ductal dilatation or inflammation. Spleen: Normal in size without focal abnormality. Adrenals/Urinary Tract: Adrenal glands are unremarkable. Kidneys are normal, without renal calculi, focal lesion, or hydronephrosis. Bladder is unremarkable. Stomach/Bowel: Stomach distended with ingested contents. No small bowel dilatation or obstruction. No inflammation. Moderate stool burden throughout the colon. Cecum located in the pelvis, the appendix is not visualized. Probable surgical clips at the base of the cecum. No pericecal or right lower quadrant inflammation. Vascular/Lymphatic: Aortic atherosclerosis. No enlarged abdominal or pelvic lymph nodes. Reproductive: Status post hysterectomy. No adnexal masses. Other: No free air, free fluid, or intra-abdominal fluid collection. Scattered densities in the anterior abdominal wall can be seen with injections. Musculoskeletal: There are no acute or suspicious osseous abnormalities. Chronic T9 compression fracture. IMPRESSION: 1. No acute abnormality in the abdomen/pelvis. No renal stones or obstructive uropathy. 2. Abdominal atherosclerosis. Electronically Signed   By: Rubye OaksMelanie  Ehinger M.D.   On: 09/28/2016 23:38    Procedures Procedures (including critical care time)  Medications Ordered in ED Medications  aspirin chewable tablet 324 mg (324 mg Oral Given 09/28/16 2225)  sodium chloride 0.9 % bolus 1,000 mL (0 mLs Intravenous Stopped 09/29/16 0143)  ondansetron (ZOFRAN) injection 4 mg (4 mg Intravenous Given 09/28/16 2226)  morphine 4 MG/ML injection 4 mg (4 mg Intravenous Given 09/28/16 2226)  morphine 4 MG/ML injection 4 mg (4 mg Intravenous Given 09/28/16 2311)  albuterol (PROVENTIL) (2.5 MG/3ML) 0.083% nebulizer solution 5 mg (5 mg Nebulization Given 09/29/16 0029)  aerochamber plus with  mask device 1 each (1 each Other Given 09/29/16 0200)     Initial Impression / Assessment and Plan / ED Course  I have reviewed the triage vital signs and the nursing notes.  Pertinent labs & imaging results that were available during my care of the patient were reviewed by me and considered in my medical decision making (see chart for details).  Clinical Course as of Sep 29 541  Fri Sep 28, 2016  2149 Neg Troponin i, poc: 0.00 [HM]  2149 No leukocytosis WBC: 8.4 [HM]  2150 No anemia Hemoglobin: 13.4 [HM]  2151 WNL Creatinine: 0.68 [HM]  2215 HEART Score 4 Wells Score 1.5  [HM]  2224 Tachycardic at  triage but no tachycardia on clinical exam  [HM]  Sat Sep 29, 2016  0016 Normal  D-Dimer, Quant: <0.27 [HM]  0017 No stones noted.  Pt will need to f/u with PCP for hematuria CT Renal Stone Study [HM]  0134 Repeat neg Troponin i, poc: 0.00 [HM]  0134 Pt well appearing.  Pain is controlled.  Tolerating PO without difficulty.  Abd remains soft.    [HM]  0135 Improving tachycardia Pulse Rate: 79 [HM]    Clinical Course User Index [HM] Dahlia Client Terril Amaro, PA-C    Patient is nontoxic, nonseptic appearing, in no apparent distress.  Patient's pain and other symptoms adequately managed in emergency department.  Fluid bolus given.  Labs, imaging and vitals reviewed.  Patient does not meet the SIRS or Sepsis criteria.  On repeat exam patient does not have a surgical abdomin and there are no peritoneal signs.  No indication of appendicitis, bowel obstruction, bowel perforation, cholecystitis, diverticulitis, PID or ectopic pregnancy.    Chest pain is not likely of cardiac etiology d/t presentation, PERC negative (doubt PE), VSS, no tracheal deviation, no JVD or new murmur, RRR, breath sounds equal bilaterally, EKG without acute abnormalities, negative troponin, and negative CXR. Pt has been advised to return to the ED if CP becomes exertional, associated with diaphoresis or nausea, radiates to left  jaw/arm, worsens or becomes concerning in any way. Pt appears reliable for follow up and is agreeable to discharge.   Case has been discussed with and seen by Dr. Verdie Mosher who agrees with the above plan to discharge. Patient discharged home with symptomatic treatment and given strict instructions for follow-up with their primary care physician.  I have also discussed reasons to return immediately to the ER.  Patient expresses understanding and agrees with plan.   Final Clinical Impressions(s) / ED Diagnoses   Final diagnoses:  Abdominal pain, unspecified abdominal location  Chest pain, unspecified type  Hematuria, unspecified type    New Prescriptions Discharge Medication List as of 09/29/2016  1:30 AM       Dierdre Forth, PA-C 09/29/16 1610    Lavera Guise, MD 09/29/16 1858

## 2016-09-29 LAB — I-STAT TROPONIN, ED: Troponin i, poc: 0 ng/mL (ref 0.00–0.08)

## 2016-09-29 MED ORDER — AEROCHAMBER PLUS W/MASK MISC
Status: AC
  Administered 2016-09-29: 1
  Filled 2016-09-29: qty 1

## 2016-09-29 MED ORDER — ALBUTEROL SULFATE (2.5 MG/3ML) 0.083% IN NEBU
5.0000 mg | INHALATION_SOLUTION | Freq: Once | RESPIRATORY_TRACT | Status: AC
  Administered 2016-09-29: 5 mg via RESPIRATORY_TRACT
  Filled 2016-09-29: qty 6

## 2016-09-29 MED ORDER — ONDANSETRON 4 MG PO TBDP: ORAL_TABLET | ORAL | 0 refills | Status: AC

## 2016-09-29 MED ORDER — ALBUTEROL SULFATE HFA 108 (90 BASE) MCG/ACT IN AERS
2.0000 | INHALATION_SPRAY | RESPIRATORY_TRACT | Status: DC | PRN
  Administered 2016-09-29: 2 via RESPIRATORY_TRACT
  Filled 2016-09-29: qty 6.7

## 2016-09-29 MED ORDER — AEROCHAMBER PLUS W/MASK MISC
1.0000 | Freq: Once | Status: AC
  Administered 2016-09-29: 1

## 2016-09-29 NOTE — ED Notes (Signed)
Pt pulled her own IV out.

## 2016-09-29 NOTE — ED Notes (Signed)
EDP went over prescription instructions with patient.

## 2016-09-29 NOTE — Discharge Instructions (Signed)
1. Medications: usual home medications, albuterol, zofran 2. Treatment: rest, drink plenty of fluids,  3. Follow Up: Please followup with your primary doctor in 2-3  days for discussion of your diagnoses and further evaluation after today's visit; if you do not have a primary care doctor use the resource guide provided to find one; Please return to the ER for persistent vomiting, worsening pain, other concerns.

## 2016-12-09 ENCOUNTER — Emergency Department (HOSPITAL_COMMUNITY)
Admission: EM | Admit: 2016-12-09 | Discharge: 2016-12-09 | Disposition: A | Discharge status: Home / Self Care | Payer: Medicare Other | Attending: Emergency Medicine | Admitting: Emergency Medicine

## 2016-12-09 ENCOUNTER — Emergency Department (HOSPITAL_COMMUNITY): Payer: Medicare Other

## 2016-12-09 ENCOUNTER — Encounter (HOSPITAL_COMMUNITY): Payer: Self-pay

## 2016-12-09 DIAGNOSIS — E119 Type 2 diabetes mellitus without complications: Secondary | ICD-10-CM | POA: Insufficient documentation

## 2016-12-09 DIAGNOSIS — F17200 Nicotine dependence, unspecified, uncomplicated: Secondary | ICD-10-CM | POA: Diagnosis not present

## 2016-12-09 DIAGNOSIS — S6992XA Unspecified injury of left wrist, hand and finger(s), initial encounter: Secondary | ICD-10-CM | POA: Diagnosis present

## 2016-12-09 DIAGNOSIS — W109XXA Fall (on) (from) unspecified stairs and steps, initial encounter: Secondary | ICD-10-CM | POA: Diagnosis not present

## 2016-12-09 DIAGNOSIS — Z79899 Other long term (current) drug therapy: Secondary | ICD-10-CM | POA: Insufficient documentation

## 2016-12-09 DIAGNOSIS — Y92009 Unspecified place in unspecified non-institutional (private) residence as the place of occurrence of the external cause: Secondary | ICD-10-CM | POA: Diagnosis not present

## 2016-12-09 DIAGNOSIS — Z7289 Other problems related to lifestyle: Secondary | ICD-10-CM | POA: Insufficient documentation

## 2016-12-09 DIAGNOSIS — S52502A Unspecified fracture of the lower end of left radius, initial encounter for closed fracture: Secondary | ICD-10-CM | POA: Insufficient documentation

## 2016-12-09 DIAGNOSIS — Y999 Unspecified external cause status: Secondary | ICD-10-CM | POA: Insufficient documentation

## 2016-12-09 DIAGNOSIS — Z794 Long term (current) use of insulin: Secondary | ICD-10-CM | POA: Diagnosis not present

## 2016-12-09 DIAGNOSIS — Z765 Malingerer [conscious simulation]: Secondary | ICD-10-CM

## 2016-12-09 DIAGNOSIS — Y9389 Activity, other specified: Secondary | ICD-10-CM | POA: Insufficient documentation

## 2016-12-09 MED ORDER — OXYCODONE-ACETAMINOPHEN 5-325 MG PO TABS
2.0000 | ORAL_TABLET | Freq: Once | ORAL | Status: AC
  Administered 2016-12-09: 2 via ORAL
  Filled 2016-12-09: qty 2

## 2016-12-09 NOTE — ED Provider Notes (Addendum)
[facility] DEPT Provider Note   CSN: [id number] Arrival date & time: 12/09/16  1845     History   Chief Complaint Chief Complaint  Patient presents with  . Wrist Pain    HPI Sylvia Greene is a 56 y.o. female.  Patient is a 56 year old female with history of diabetes, bipolar. She presents for evaluation of left wrist pain. Reports slipping on the stairs leading into her mobile home. She landed on an outstretched hand. She reports taking ibuprofen on day, however this has not helped.   The history is provided by the patient.  Wrist Pain  This is a new problem. The current episode started yesterday. The problem occurs constantly. The problem has not changed since onset.Exacerbated by: Movement and palpation. Nothing relieves the symptoms. Treatments tried: Ibuprofen. The treatment provided no relief.    Past Medical History:  Diagnosis Date  . Bipolar 1 disorder (HCC)   . Diabetes mellitus without complication (HCC)     There are no active problems to display for this patient.   Past Surgical History:  Procedure Laterality Date  . CHOLECYSTECTOMY    . EYE SURGERY    . LEG SURGERY    . PARTIAL HYSTERECTOMY    . THYROIDECTOMY      OB History    No data available       Home Medications    Prior to Admission medications   Medication Sig Start Date End Date Taking? Authorizing Provider  buPROPion (WELLBUTRIN) 100 MG tablet Take 500 mg by mouth 2 (two) times daily.    Historical Provider, MD  clonazePAM (KLONOPIN) 0.5 MG tablet Take 0.5 mg by mouth 3 (three) times daily.    Historical Provider, MD  insulin aspart (NOVOLOG) 100 UNIT/ML injection Inject 2-10 Units into the skin 3 (three) times daily before meals.    Historical Provider, MD  Insulin Glargine (TOUJEO SOLOSTAR [place]) Inject 35 Units into the skin at bedtime.    Historical Provider, MD  lisinopril (PRINIVIL,ZESTRIL) 10 MG tablet Take 10 mg by mouth daily.    Historical Provider, MD  lovastatin (MEVACOR)  40 MG tablet Take 40 mg by mouth at bedtime.    Historical Provider, MD  ondansetron (ZOFRAN ODT) 4 MG disintegrating tablet 4mg  ODT q4 hours prn nausea/vomit 09/29/16   Hannah Muthersbaugh, PA-C  ondansetron (ZOFRAN) 4 MG tablet Take 1 tablet (4 mg total) by mouth every 6 (six) hours. 07/03/16   Ivery QualeHobson Bryant, PA-C  sertraline (ZOLOFT) 100 MG tablet Take 100 mg by mouth daily.    Historical Provider, MD    Family History No family history on file.  Social History Social History  Substance Use Topics  . Smoking status: Current Every Day Smoker  . Smokeless tobacco: Never Used  . Alcohol use No     Allergies   Compazine [prochlorperazine maleate]; Eggs or egg-derived products; Inapsine [droperidol]; Hydrocodone; and Penicillins   Review of Systems Review of Systems  All other systems reviewed and are negative.    Physical Exam Updated Vital Signs BP (!) 140/106 (BP Location: Right Arm)   Pulse 66   Temp 98.6 F (37 C) (Oral)   Resp 16   Ht 5\' 5"  (1.651 m)   Wt 140 lb (63.5 kg)   SpO2 100%   BMI 23.30 kg/m   Physical Exam  Constitutional: She is oriented to person, place, and time. She appears well-developed and well-nourished. No distress.  HENT:  Head: Normocephalic and atraumatic.  Neck: Normal range of  motion. Neck supple.  Pulmonary/Chest: Effort normal.  Musculoskeletal:  The left wrist has perhaps mild swelling over the distal radius, however no obvious deformity. She is able to wiggle all fingers and sensation is intact throughout the entire hand.  Neurological: She is alert and oriented to person, place, and time.  Skin: She is not diaphoretic.  Nursing note and vitals reviewed.    ED Treatments / Results  Labs (all labs ordered are listed, but only abnormal results are displayed) Labs Reviewed - No data to display  EKG  EKG Interpretation None       Radiology No results found.  Procedures Procedures (including critical care  time)  Medications Ordered in ED Medications  oxyCODONE-acetaminophen (PERCOCET/ROXICET) 5-325 MG per tablet 2 tablet (not administered)     Initial Impression / Assessment and Plan / ED Course  I have reviewed the triage vital signs and the nursing notes.  Pertinent labs & imaging results that were available during my care of the patient were reviewed by me and considered in my medical decision making (see chart for details).  This patient presents here with a wrist injury she reports happened this morning when she slipped on wet steps.  When reviewing the radiology interpretation, the radiologist documented that her x-rays are identical in appearance to the films that were performed at [facility] earlier this afternoon. When patient was asked about this, she denied having gone to [place].  When she presented, she requested medication for pain. She was given 2 Percocet. I advised her she was going to be discharged with a wrist splint and follow-up with orthopedics. He was instructed to take ibuprofen and ice her wrist. When reviewing her prescriptions filled on the [place] data base, this patient receives 60 oxycodone tablets monthly. I advised her that she could take these medications as needed for her pain. She told me she has not filled these in many months, however the [place] data base says otherwise.  It appears as though I have caught this patient in multiple falsifications about her visits to other hospitals and prescription filling habits. As such, I have advised her I would be unable to prescribe her any more pain medication. She is to ice her wrist, take ibuprofen, and follow-up with orthopedics.  While I was preparing the discharge instructions, the patient eloped from the emergency department.  Final Clinical Impressions(s) / ED Diagnoses   Final diagnoses:  None    New Prescriptions New Prescriptions   No medications on file     Geoffery Lyons,  MD 12/09/16 2025    Geoffery Lyons, MD 12/09/16 2029

## 2016-12-09 NOTE — ED Triage Notes (Signed)
Reports of sipping on porch this morning while going down steps. Complains of left wrist pain.

## 2016-12-09 NOTE — ED Notes (Signed)
Pt walked out of dept after EDP questioned her about being seen  At another facility today. Pt was asked if she was not waiting for split or discharge papers, pt stated no. Pharmacy tech pt left while she was updating her med list.

## 2016-12-09 NOTE — ED Notes (Signed)
Pt left after being confronted by EDP about being seen earlier today & having pain meds. Pt walked out w/o splint or discharge papers.

## 2016-12-09 NOTE — ED Notes (Signed)
Pt states fell this morning on some wet steps. Tried to brace self when falling. Pt complaining of left wrist pain. No swelling noted, pt with good sensation & cap refill. Pt says thumb is a little numb.

## 2018-04-19 IMAGING — DX DG CHEST 2V
2 series · 2 of 2 positions shown · non-contrast
Comparison: None.

CLINICAL DATA: Chest pain. Central chest tightness and shortness of
breath.

EXAM:
CHEST  2 VIEW

[chest pa]
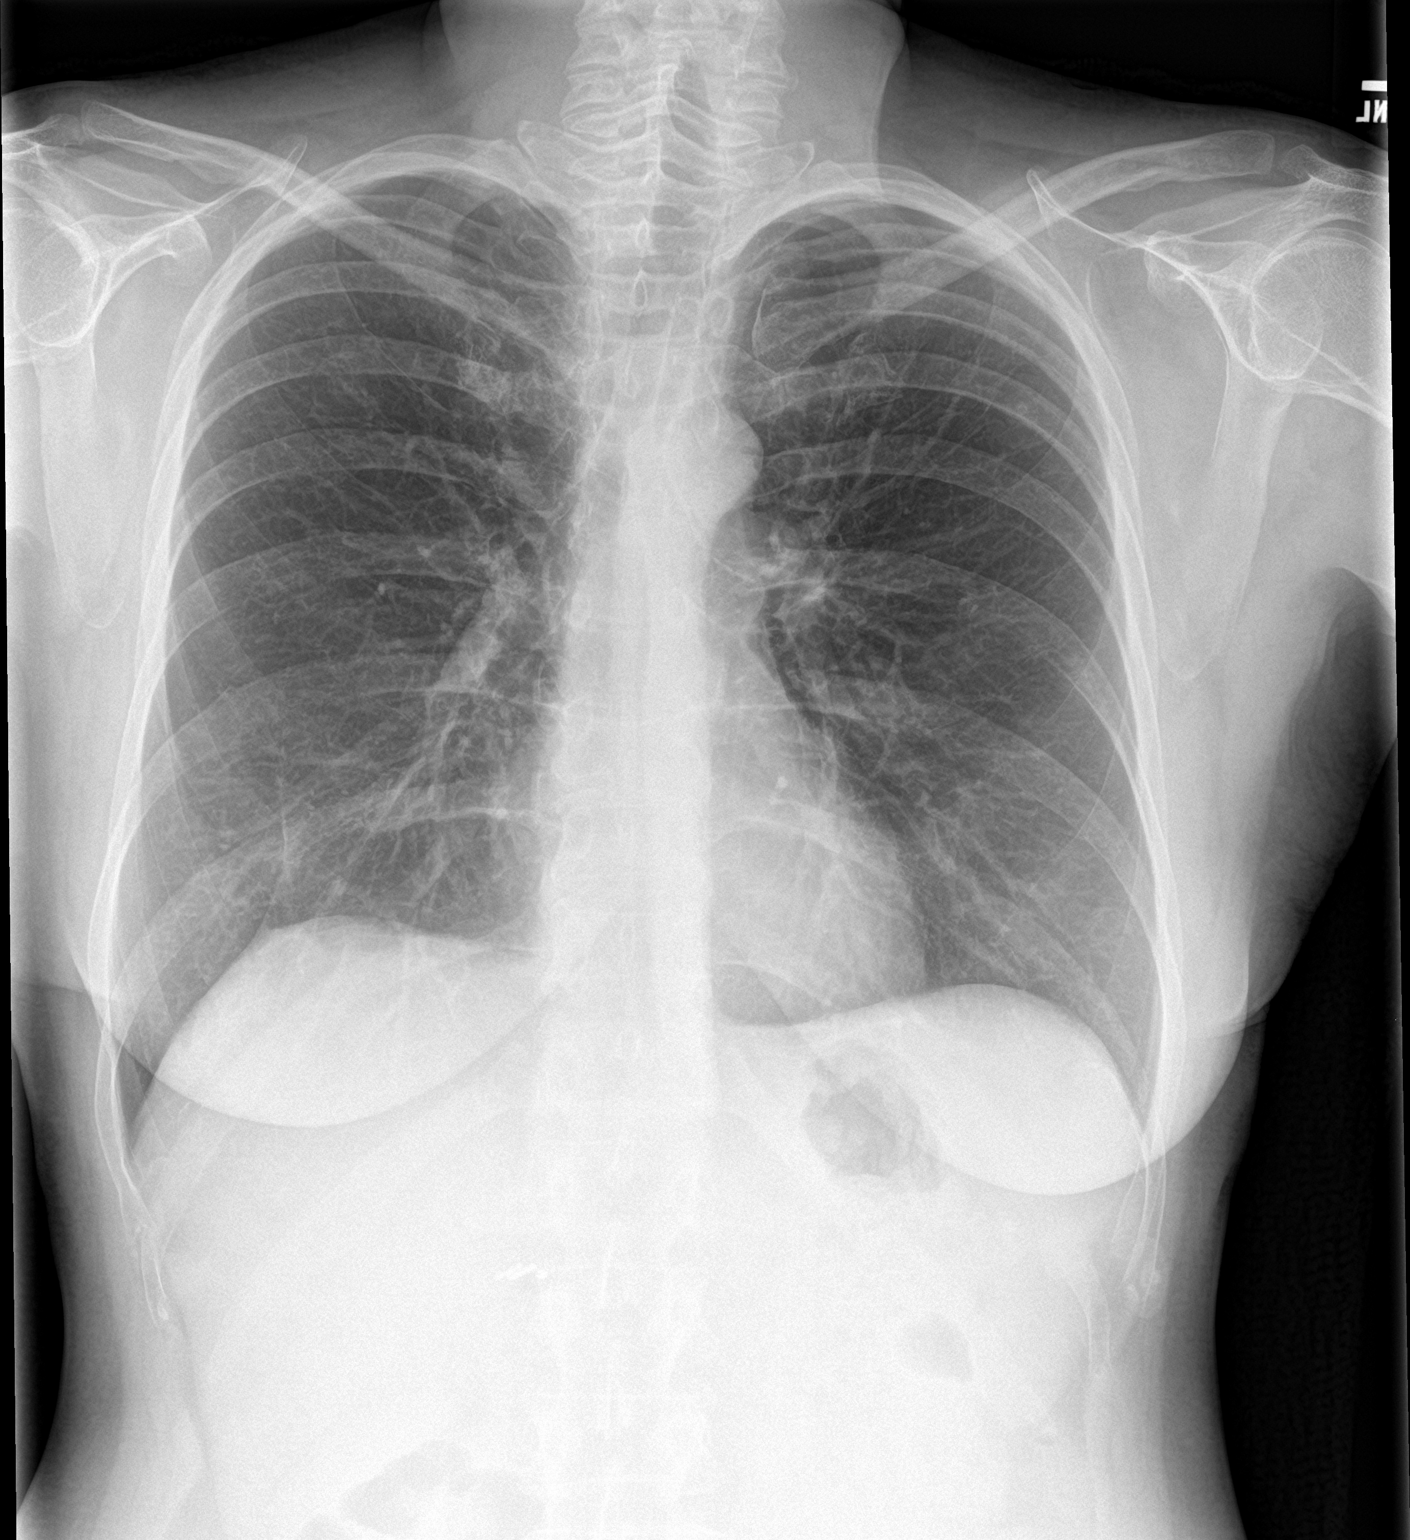

[chest lat]
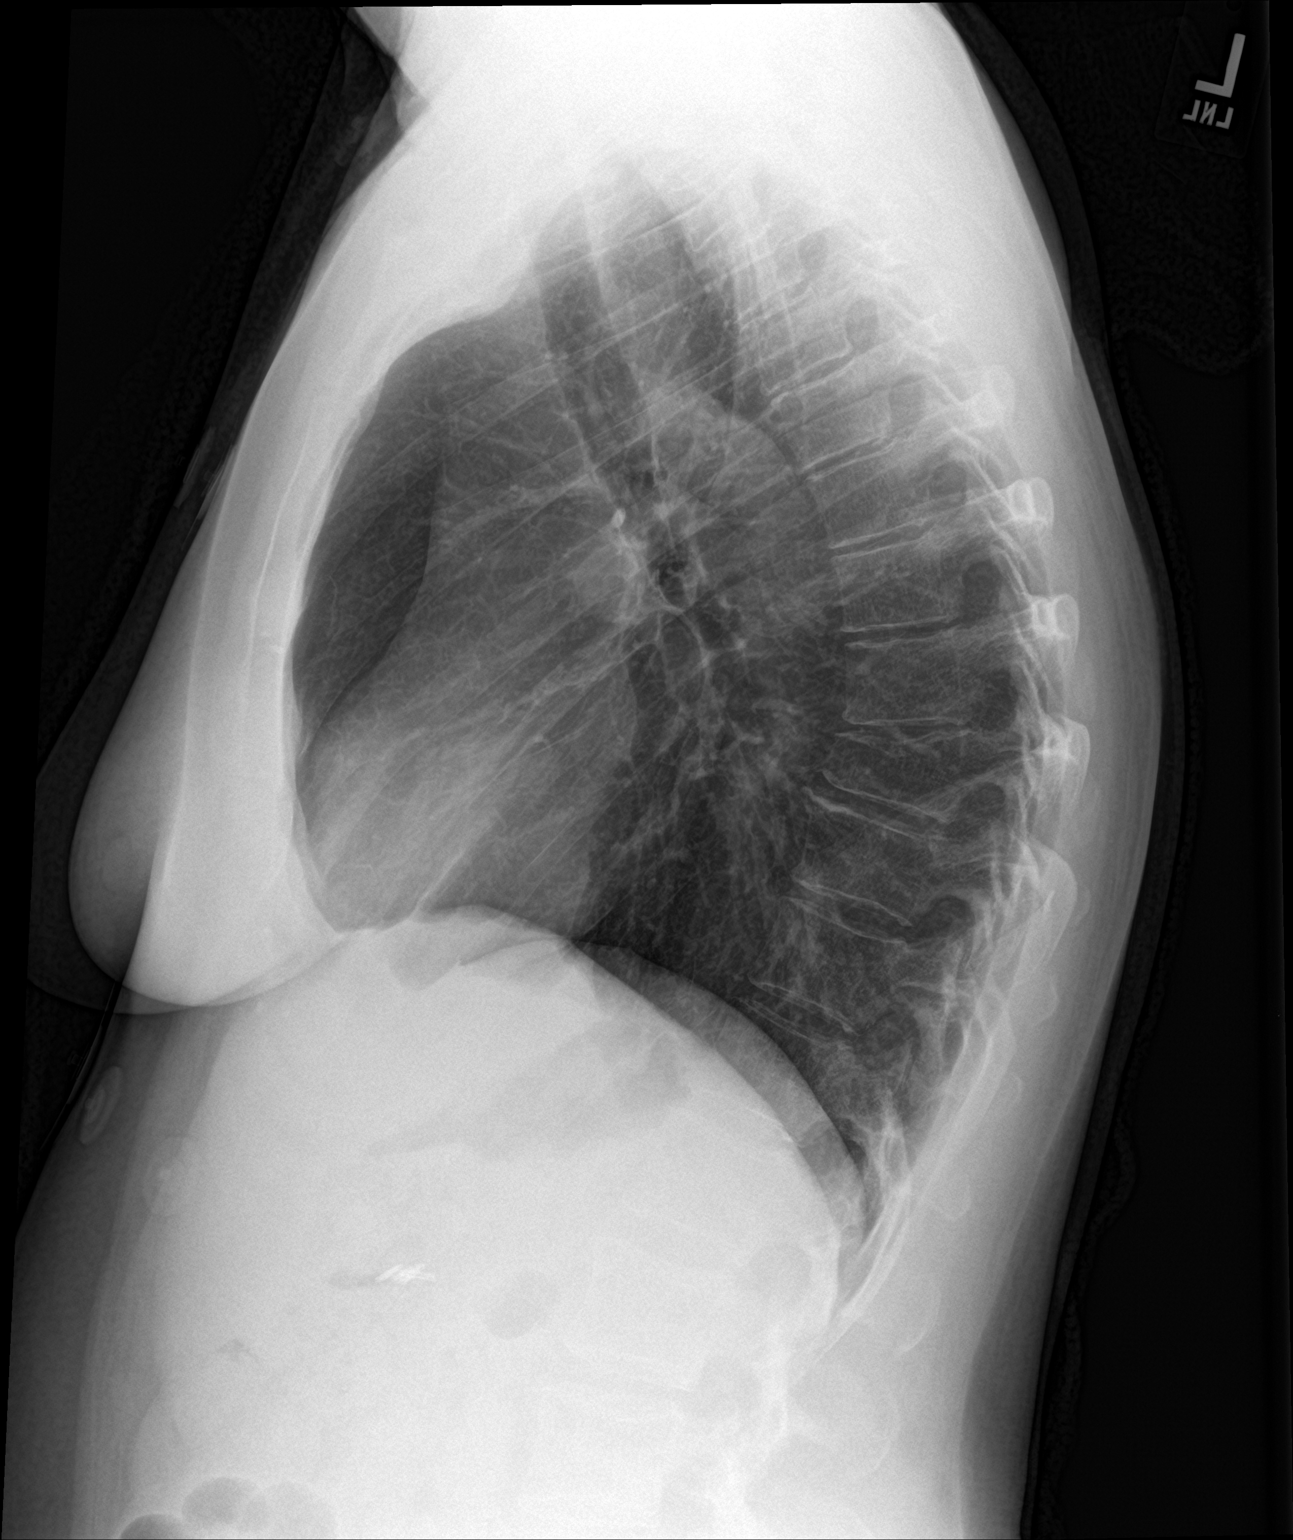

[2 of 2 positions shown; findings below may reference images not displayed]

FINDINGS: The cardiomediastinal contours are normal. Linear right lung base
density is the appearance of scarring. Pulmonary vasculature is
normal. No consolidation, pleural effusion, or pneumothorax. No
acute osseous abnormalities are seen. Chronic wedging lower thoracic
vertebra compared to thoracic spine radiographs 03/24/2015.
IMPRESSION: No active cardiopulmonary disease.

## 2018-06-30 IMAGING — DX DG WRIST COMPLETE 3+V*L*
4 series · 4 of 4 positions shown · non-contrast
Comparison: Wrist radiograph earlier same day.

CLINICAL DATA: Patient with wrist pain status post fall. History of
prior fracture.

EXAM:
LEFT WRIST - COMPLETE 3+ VIEW

[wrist pa]
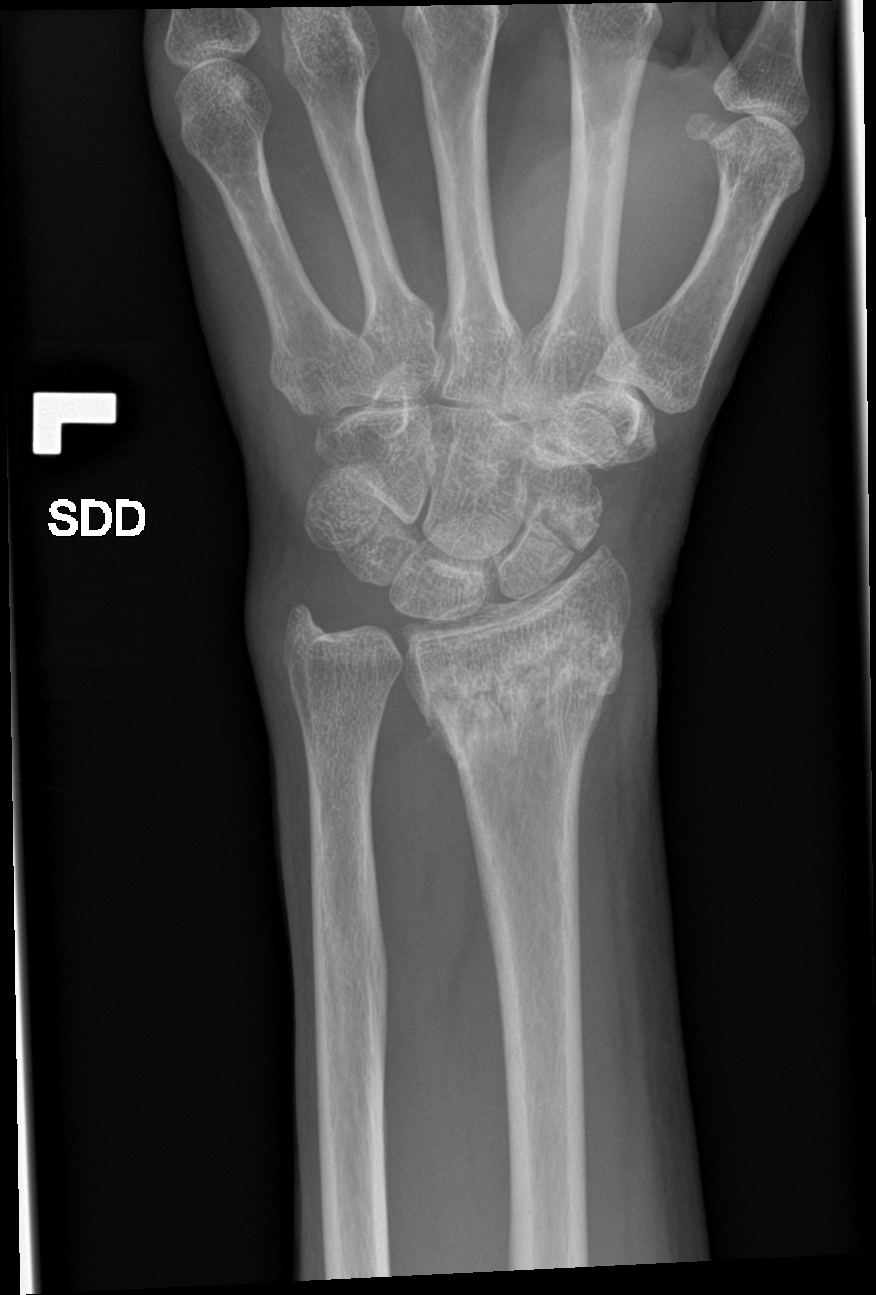

[wrist obl]
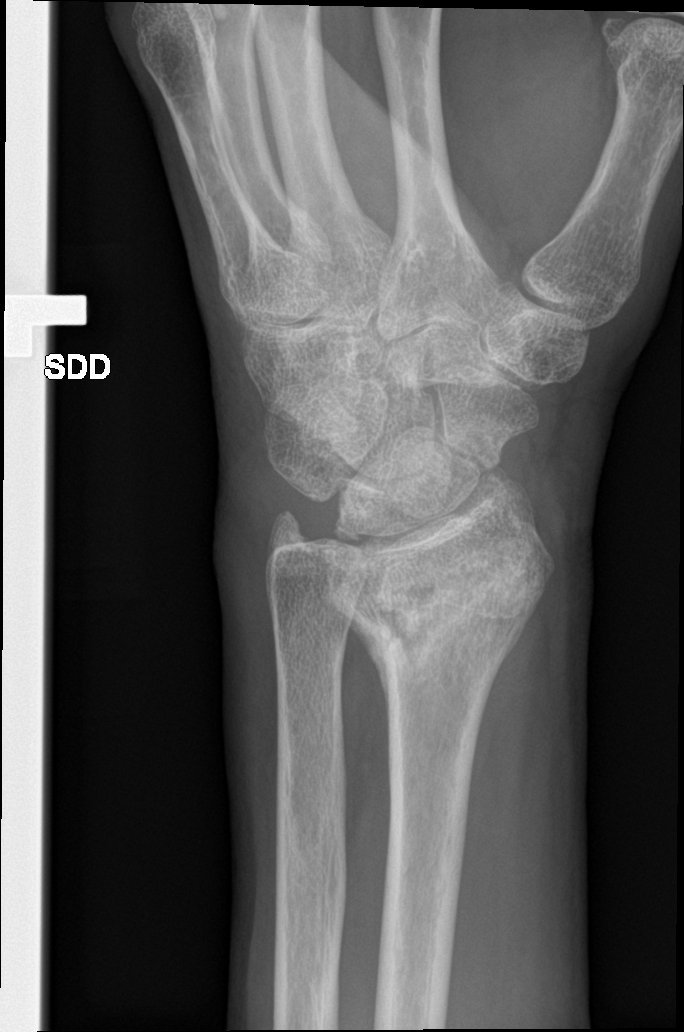

[wrist lat]
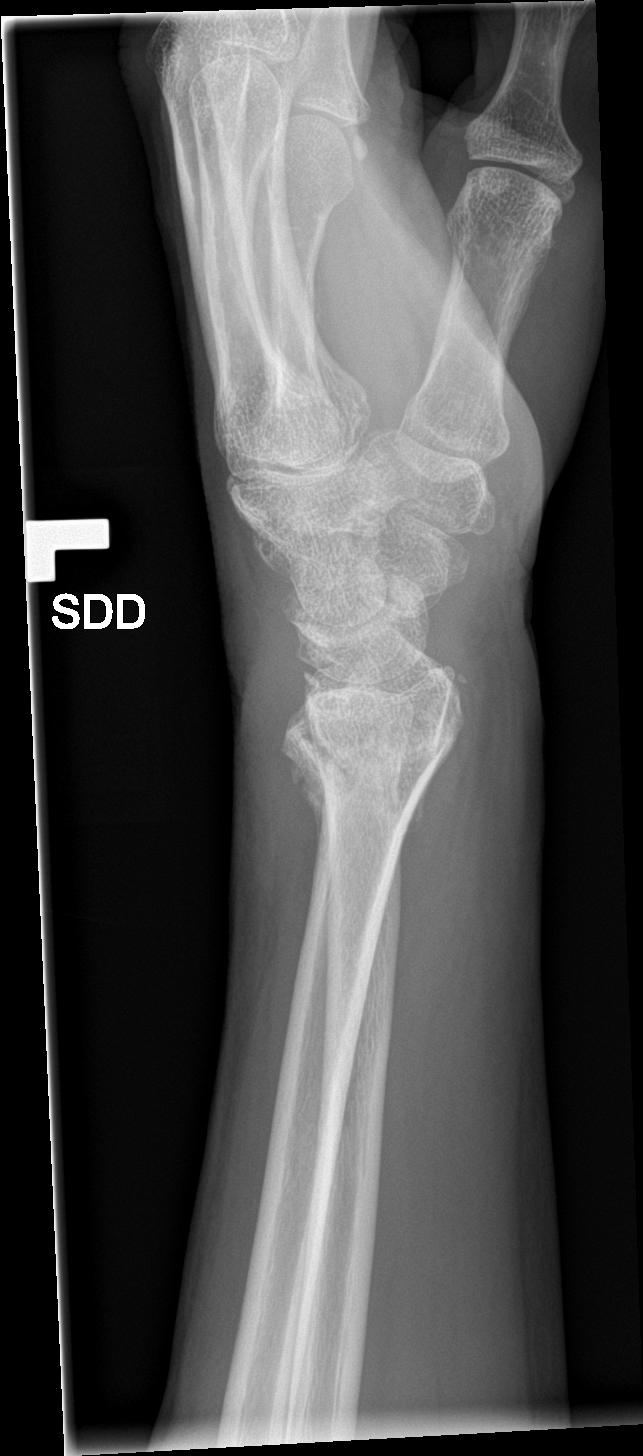

[wrist navicular]
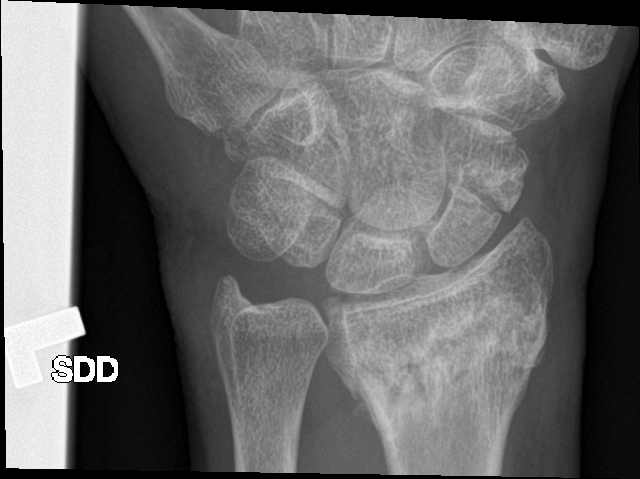

[4 of 4 positions shown; findings below may reference images not displayed]

FINDINGS: Re- demonstrated mildly impacted transverse distal radial fracture
with sclerosis about the fracture line. Corticated fracture through
the scaphoid waist. No evidence for associated acute fractures.
IMPRESSION: Findings compatible with subacute impacted distal radius fracture.

Chronic appearing fracture through the scaphoid waist.

## 2019-10-23 DEATH — deceased
